# Patient Record
Sex: Female | Born: 2000
Health system: Southern US, Community
[De-identification: ages and names within clinical notes are randomized; demographics above are authoritative.]

## PROBLEM LIST (undated history)

## (undated) ENCOUNTER — Emergency Department (HOSPITAL_BASED_OUTPATIENT_CLINIC_OR_DEPARTMENT_OTHER): Admission: EM | Payer: 59 | Source: Home / Self Care

---
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
## 2021-08-18 ENCOUNTER — Ambulatory Visit: Payer: Self-pay

## 2021-08-18 ENCOUNTER — Other Ambulatory Visit: Payer: Self-pay

## 2021-08-18 ENCOUNTER — Other Ambulatory Visit: Payer: Self-pay | Admitting: Family Medicine

## 2021-08-18 DIAGNOSIS — M25532 Pain in left wrist: Secondary | ICD-10-CM

## 2022-07-17 ENCOUNTER — Ambulatory Visit: Payer: Self-pay

## 2022-07-17 ENCOUNTER — Other Ambulatory Visit: Payer: Self-pay | Admitting: Family Medicine

## 2022-07-17 DIAGNOSIS — Z0000 Encounter for general adult medical examination without abnormal findings: Secondary | ICD-10-CM
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
## 2024-04-15 NOTE — Progress Notes (Signed)
 Hs completed

## 2024-06-19 ENCOUNTER — Inpatient Hospital Stay (HOSPITAL_COMMUNITY)
Admission: AD | Admit: 2024-06-19 | Discharge: 2024-06-19 | Disposition: A | Discharge status: Home / Self Care | Attending: Obstetrics and Gynecology | Admitting: Obstetrics and Gynecology

## 2024-06-19 ENCOUNTER — Inpatient Hospital Stay (HOSPITAL_COMMUNITY)

## 2024-06-19 ENCOUNTER — Encounter (HOSPITAL_COMMUNITY): Payer: Self-pay | Admitting: Obstetrics and Gynecology

## 2024-06-19 DIAGNOSIS — N3000 Acute cystitis without hematuria: Secondary | ICD-10-CM

## 2024-06-19 DIAGNOSIS — Z3A01 Less than 8 weeks gestation of pregnancy: Secondary | ICD-10-CM

## 2024-06-19 DIAGNOSIS — O26891 Other specified pregnancy related conditions, first trimester: Secondary | ICD-10-CM

## 2024-06-19 DIAGNOSIS — R109 Unspecified abdominal pain: Secondary | ICD-10-CM

## 2024-06-19 LAB — WET PREP, GENITAL
Clue Cells Wet Prep HPF POC: NONE SEEN
Sperm: NONE SEEN
Trich, Wet Prep: NONE SEEN
WBC, Wet Prep HPF POC: <10 (ref <10)
Yeast Wet Prep HPF POC: NONE SEEN

## 2024-06-19 LAB — CBC
HCT: 38.9 % (ref 36.0–46.0)
Hemoglobin: 12.6 g/dL (ref 12.0–15.0)
MCH: 26.4 pg (ref 26.0–34.0)
MCHC: 32.4 g/dL (ref 30.0–36.0)
MCV: 81.6 fL (ref 80.0–100.0)
Platelets: 242 K/uL (ref 150–400)
RBC: 4.77 MIL/uL (ref 3.87–5.11)
RDW: 13.9 % (ref 11.5–15.5)
WBC: 9.9 K/uL (ref 4.0–10.5)
nRBC: 0 % (ref 0.0–0.2)

## 2024-06-19 LAB — URINALYSIS, ROUTINE W REFLEX MICROSCOPIC
Bilirubin Urine: NEGATIVE
Glucose, UA: NEGATIVE mg/dL
Hgb urine dipstick: NEGATIVE
Ketones, ur: NEGATIVE mg/dL
Nitrite: NEGATIVE
Protein, ur: NEGATIVE mg/dL
Specific Gravity, Urine: 1.009 (ref 1.005–1.030)
pH: 6 (ref 5.0–8.0)

## 2024-06-19 LAB — HCG, QUANTITATIVE, PREGNANCY: hCG, Beta Chain, Quant, S: 34127 m[IU]/mL — ABNORMAL HIGH (ref <5)

## 2024-06-19 LAB — POCT PREGNANCY, URINE: Preg Test, Ur: POSITIVE — AB

## 2024-06-19 LAB — ABO/RH
ABO/RH(D): O NEG
Antibody Screen: NEGATIVE

## 2024-06-19 LAB — HIV ANTIBODY (ROUTINE TESTING W REFLEX): HIV Screen 4th Generation wRfx: NONREACTIVE

## 2024-06-19 MED ORDER — CEFADROXIL 500 MG PO CAPS: 500.0000 mg | ORAL_CAPSULE | Freq: Two times a day (BID) | ORAL | 0 refills | Status: AC

## 2024-06-19 NOTE — MAU Note (Signed)
 Rita Hawkins is a 23 y.o. at Unknown here in MAU reporting: right sided abd pain that started 2 hours ago. Pain is sharp and intermitten. Pt reports her lmp was 03/10/2024 and she had an SAB in mid October. The md office followed the levels down in the office . Positive preg test in office 2 weejs ago.    LMP: 03/10/2024 Onset of complaint: today Pain score: 4/10 There were no vitals filed for this visit.   FHT: na  Lab orders placed from triage: poc

## 2024-06-19 NOTE — Progress Notes (Signed)
 Joesph Sear PA in with pt to discuss test results and d/c plan. Written and verbal d/c instructions given and pt voiced understanding. Pt d/c home by provider

## 2024-06-19 NOTE — Discharge Instructions (Signed)
 I have sent an antibiotic to the pharmacy to treat the UTI. If the culture comes back in a few days showing that we need to change the antibiotic, I will be in touch with you. Otherwise, finish the full bottle! You may take acetaminophen 500 mg every 4 hours as needed for the pain.  If you are not doing so already, please start taking a prenatal vitamin daily. You can follow up for prenatal care with the OB of your choice.

## 2024-06-19 NOTE — MAU Provider Note (Signed)
 Chief Complaint:  Abdominal Pain   HPI   None     Rita Hawkins is a 23 y.o. G1P0 at Unknown who presents to maternity admissions reporting right abdominal pain. LMP was 03/10/2024 and she had an SAB in October with her OB following hCG levels afterward in the office. She had a positive pregnancy test in her OB office 2 weeks ago. She states right sided abdominal pain started today about 2 hours ago. The pain is sharp and intermittent with 4/10 intensity. Denies fever, vaginal bleeding.   Pregnancy Course: Receives care at [facility].   No past medical history on file. OB History  Gravida Para Term Preterm AB Living  1       SAB IAB Ectopic Multiple Live Births          # Outcome Date GA Lbr Len/2nd Weight Sex Type Anes PTL Lv  1 Current            No past surgical history on file. No family history on file. Social History[1] Allergies[2] No medications prior to admission.    I have reviewed patient's Past Medical Hx, Surgical Hx, Family Hx, Social Hx, medications and allergies.   ROS  Pertinent items noted in HPI and remainder of comprehensive ROS otherwise negative.   PHYSICAL EXAM  Patient Vitals for the past 24 hrs:  BP Temp Temp src Pulse Resp SpO2 Height Weight  06/19/24 1714 (!) 140/64 98.5 F (36.9 C) Oral 71 17 99 % 5' 5 (1.651 m) 109.3 kg    Constitutional: Well-developed, well-nourished female in no acute distress.  HEENT: atraumatic, normocephalic. Neck has normal ROM. EOM intact. Cardiovascular: normal rate & rhythm, warm and well-perfused Respiratory: normal effort, no problems with respiration noted GI: Abd soft, non-tender, non-distended MSK: Extremities nontender, no edema, normal ROM Skin: warm and dry. Acyanotic, no jaundice or pallor. Neurologic: Alert and oriented x 4. No abnormal coordination. Psychiatric: Normal mood. Speech not slurred, not rapid/pressured. Patient is cooperative.  Labs: Results for orders placed or performed during  the hospital encounter of 06/19/24 (from the past 24 hours)  Pregnancy, urine POC     Status: Abnormal   Collection Time: 06/19/24  4:53 PM  Result Value Ref Range   Preg Test, Ur POSITIVE (A) NEGATIVE  Wet prep, genital     Status: None   Collection Time: 06/19/24  5:15 PM   Specimen: PATH Cytology Cervicovaginal Ancillary Only  Result Value Ref Range   Yeast Wet Prep HPF POC NONE SEEN NONE SEEN   Trich, Wet Prep NONE SEEN NONE SEEN   Clue Cells Wet Prep HPF POC NONE SEEN NONE SEEN   WBC, Wet Prep HPF POC <10 <10   Sperm NONE SEEN   Urinalysis, Routine w reflex microscopic -Urine, Clean Catch     Status: Abnormal   Collection Time: 06/19/24  5:18 PM  Result Value Ref Range   Color, Urine YELLOW YELLOW   APPearance HAZY (A) CLEAR   Specific Gravity, Urine 1.009 1.005 - 1.030   pH 6.0 5.0 - 8.0   Glucose, UA NEGATIVE NEGATIVE mg/dL   Hgb urine dipstick NEGATIVE NEGATIVE   Bilirubin Urine NEGATIVE NEGATIVE   Ketones, ur NEGATIVE NEGATIVE mg/dL   Protein, ur NEGATIVE NEGATIVE mg/dL   Nitrite NEGATIVE NEGATIVE   Leukocytes,Ua MODERATE (A) NEGATIVE   RBC / HPF 0-5 0 - 5 RBC/hpf   WBC, UA 0-5 0 - 5 WBC/hpf   Bacteria, UA MANY (A) NONE SEEN  Squamous Epithelial / HPF 6-10 0 - 5 /HPF   Mucus PRESENT    Non Squamous Epithelial 0-5 (A) NONE SEEN  CBC     Status: None   Collection Time: 06/19/24  5:41 PM  Result Value Ref Range   WBC 9.9 4.0 - 10.5 K/uL   RBC 4.77 3.87 - 5.11 MIL/uL   Hemoglobin 12.6 12.0 - 15.0 g/dL   HCT 61.0 63.9 - 53.9 %   MCV 81.6 80.0 - 100.0 fL   MCH 26.4 26.0 - 34.0 pg   MCHC 32.4 30.0 - 36.0 g/dL   RDW 86.0 88.4 - 84.4 %   Platelets 242 150 - 400 K/uL   nRBC 0.0 0.0 - 0.2 %  ABO/Rh     Status: None   Collection Time: 06/19/24  5:41 PM  Result Value Ref Range   ABO/RH(D) O NEG    Antibody Screen      NEG Performed at [facility], [address], [place], [place] [address]   hCG, quantitative, pregnancy     Status: Abnormal   Collection  Time: 06/19/24  5:41 PM  Result Value Ref Range   hCG, Beta Chain, Quant, S 34,127 (H) <5 mIU/mL    Imaging:  US  OB LESS THAN 14 WEEKS WITH OB TRANSVAGINAL Result Date: 06/19/2024 CLINICAL DATA:  Pregnancy and abdominal pain. EXAM: OBSTETRIC <14 WK US  AND TRANSVAGINAL OB US  TECHNIQUE: Both transabdominal and transvaginal ultrasound examinations were performed for complete evaluation of the gestation as well as the maternal uterus, adnexal regions, and pelvic cul-de-sac. Transvaginal technique was performed to assess early pregnancy. COMPARISON:  None Available. FINDINGS: Intrauterine gestational sac: Single intrauterine gestational sac. Yolk sac:  Seen Embryo:  Present Cardiac Activity: Detected Heart Rate: 100 bpm CRL:  2 mm   5 w   5 d                  US  EDC: 02/14/2025 Subchorionic hemorrhage:  None visualized. Maternal uterus/adnexae: The maternal ovaries are unremarkable. There is a corpus luteum in the left ovary. IMPRESSION: Single live intrauterine pregnancy with an estimated gestational age of [redacted] weeks, 5 days. Electronically Signed   By: Vanetta Chou M.D.   On: 06/19/2024 19:36    MDM & MAU COURSE  MDM: Moderate  MAU Course: -Mildly elevated BP. Other vitals within normal limits. -Ectopic rule out: CBC, US , beta-hCG, blood type. -Wet prep, UA, and GC/chlamydia to rule out infectious causes.  -CBC within normal limits. Blood type O NEGATIVE. -Wet prep negative. -UA with moderate leukocytes and non squamous epithelial cells, likely UTI. Will send culture and start antibiotics. -US  with IUGS, YS, and FP with CRL consistent with [redacted]w[redacted]d. Appropriate cardiac activity. -hCG appropriate for gestational age.  Differential diagnosis considered for 1st trimester lower abdominal pain includes but is not limited to: ectopic pregnancy, SAB, torsion, UTI, pyelonephritis, PID, cervicitis, appendicitis, diverticulitis, constipation, nephrolithiasis   Orders Placed This Encounter  Procedures    Wet prep, genital   Culture, OB Urine   US  OB LESS THAN 14 WEEKS WITH OB TRANSVAGINAL   CBC   hCG, quantitative, pregnancy   RPR W/RFLX TO RPR TITER, TREPONEMAL AB, SCREEN AND DIAGNOSIS   HIV Antibody (routine testing w rflx)   Urinalysis, Routine w reflex microscopic -Urine, Clean Catch   Diet NPO time specified   Pregnancy, urine POC   ABO/Rh   Discharge patient   Meds ordered this encounter  Medications   cefadroxil (DURICEF) 500 MG  capsule    Sig: Take 1 capsule (500 mg total) by mouth 2 (two) times daily for 7 days.    Dispense:  14 capsule    Refill:  0    ASSESSMENT   1. Acute cystitis without hematuria   2. Right sided abdominal pain   3. [redacted] weeks gestation of pregnancy     PLAN  Discharge home in stable condition with return precautions.  Duricef 500 mg BID x7 days for UTI. Urine culture pending.  Tylenol prn for pain. Start prenatal vitamin and establish with OB for prenatal care.    Allergies as of 06/19/2024   No Known Allergies      Medication List     TAKE these medications    cefadroxil 500 MG capsule Commonly known as: DURICEF Take 1 capsule (500 mg total) by mouth 2 (two) times daily for 7 days.        Joesph DELENA Sear, PA      [1]  Social History Tobacco Use   Smoking status: Never   Smokeless tobacco: Never  Vaping Use   Vaping status: Former  Substance Use Topics   Alcohol use: Never   Drug use: Never  [2] No Known Allergies

## 2024-06-20 LAB — GC/CHLAMYDIA PROBE AMP (~~LOC~~) NOT AT ARMC
Chlamydia: NEGATIVE
Comment: NEGATIVE
Comment: NORMAL
Neisseria Gonorrhea: NEGATIVE

## 2024-06-20 LAB — SYPHILIS: RPR W/REFLEX TO RPR TITER AND TREPONEMAL ANTIBODIES, TRADITIONAL SCREENING AND DIAGNOSIS ALGORITHM: RPR Ser Ql: NONREACTIVE

## 2024-06-28 ENCOUNTER — Inpatient Hospital Stay (HOSPITAL_COMMUNITY)
Admission: AD | Admit: 2024-06-28 | Discharge: 2024-06-29 | Disposition: A | Discharge status: Home / Self Care | Payer: Self-pay | Attending: Obstetrics and Gynecology | Admitting: Obstetrics and Gynecology

## 2024-06-28 DIAGNOSIS — O26891 Other specified pregnancy related conditions, first trimester: Secondary | ICD-10-CM

## 2024-06-28 DIAGNOSIS — O26892 Other specified pregnancy related conditions, second trimester: Secondary | ICD-10-CM | POA: Diagnosis not present

## 2024-06-28 DIAGNOSIS — O209 Hemorrhage in early pregnancy, unspecified: Secondary | ICD-10-CM

## 2024-06-28 DIAGNOSIS — O208 Other hemorrhage in early pregnancy: Secondary | ICD-10-CM | POA: Diagnosis not present

## 2024-06-28 DIAGNOSIS — R188 Other ascites: Secondary | ICD-10-CM | POA: Insufficient documentation

## 2024-06-28 DIAGNOSIS — R109 Unspecified abdominal pain: Secondary | ICD-10-CM | POA: Insufficient documentation

## 2024-06-28 DIAGNOSIS — Z6791 Unspecified blood type, Rh negative: Secondary | ICD-10-CM | POA: Insufficient documentation

## 2024-06-28 DIAGNOSIS — Z3A14 14 weeks gestation of pregnancy: Secondary | ICD-10-CM | POA: Diagnosis not present

## 2024-06-28 DIAGNOSIS — O2341 Unspecified infection of urinary tract in pregnancy, first trimester: Secondary | ICD-10-CM | POA: Insufficient documentation

## 2024-06-28 DIAGNOSIS — Z3A01 Less than 8 weeks gestation of pregnancy: Secondary | ICD-10-CM

## 2024-06-28 DIAGNOSIS — Z87440 Personal history of urinary (tract) infections: Secondary | ICD-10-CM | POA: Insufficient documentation

## 2024-06-28 NOTE — MAU Note (Signed)
..  Rita Hawkins is a 23 y.o. at [redacted]w[redacted]d here in MAU reporting: cramping that began 2 hours ago. Vaginal bleeding that is brown only when she wipes and she noticed it after the cramping.  Last intercourse: weeks ago Pain score: 3/10 Vitals:   06/29/24 0003  BP: 126/81  Pulse: 82  Resp: 18  Temp: 98.9 F (37.2 C)  SpO2: 100%     FHT:n/a Lab orders placed from triage:  UA

## 2024-06-29 ENCOUNTER — Inpatient Hospital Stay (HOSPITAL_COMMUNITY)

## 2024-06-29 ENCOUNTER — Encounter (HOSPITAL_COMMUNITY): Payer: Self-pay | Admitting: Obstetrics and Gynecology

## 2024-06-29 DIAGNOSIS — O209 Hemorrhage in early pregnancy, unspecified: Secondary | ICD-10-CM | POA: Diagnosis not present

## 2024-06-29 DIAGNOSIS — O26891 Other specified pregnancy related conditions, first trimester: Secondary | ICD-10-CM | POA: Diagnosis not present

## 2024-06-29 DIAGNOSIS — Z6791 Unspecified blood type, Rh negative: Secondary | ICD-10-CM

## 2024-06-29 DIAGNOSIS — R109 Unspecified abdominal pain: Secondary | ICD-10-CM | POA: Diagnosis not present

## 2024-06-29 DIAGNOSIS — O2341 Unspecified infection of urinary tract in pregnancy, first trimester: Secondary | ICD-10-CM

## 2024-06-29 DIAGNOSIS — Z3A01 Less than 8 weeks gestation of pregnancy: Secondary | ICD-10-CM | POA: Diagnosis not present

## 2024-06-29 LAB — URINALYSIS, ROUTINE W REFLEX MICROSCOPIC
Bilirubin Urine: NEGATIVE
Glucose, UA: NEGATIVE mg/dL
Ketones, ur: NEGATIVE mg/dL
Nitrite: NEGATIVE
Protein, ur: NEGATIVE mg/dL
Specific Gravity, Urine: 1.01 (ref 1.005–1.030)
pH: 6 (ref 5.0–8.0)

## 2024-06-29 LAB — URINALYSIS, MICROSCOPIC (REFLEX)

## 2024-06-29 MED ORDER — RHO D IMMUNE GLOBULIN 1500 UNIT/2ML IJ SOSY
300.0000 ug | PREFILLED_SYRINGE | Freq: Once | INTRAMUSCULAR | Status: AC
  Administered 2024-06-29: 300 ug via INTRAMUSCULAR
  Filled 2024-06-29: qty 2

## 2024-06-29 NOTE — MAU Provider Note (Signed)
 History    None     Chief Complaint:  Abdominal Pain and Vaginal Bleeding   Rita Hawkins is  23 y.o. G2P0010 who is here for cramping and brown spotting x 2 hours.  She is [redacted]w[redacted]d weeks gestation  by early ultrasound.  Live single intrauterine pregnancy verified by 5.5-week ultrasound on 06/19/2024.  Dx. UTI 06/19/24. Tx w/ Duricef who she states she took as directed but thinks she may still have UTI. Culture not done or not available.   ROS Abdominal Pain: 3/10 cramping Vaginal bleeding: brown and spotting.   Passage of clots or tissue: Denies Dizziness: Denies  O NEG  Physical Exam   Patient Vitals for the past 24 hrs:  BP Temp Temp src Pulse Resp SpO2 Height Weight  06/29/24 0003 126/81 98.9 F (37.2 C) Oral 82 18 100 % 5' 5 (1.651 m) 109.7 kg   Constitutional: Well-nourished female in no apparent distress. No pallor Neuro: Alert and oriented 4 Cardiovascular: Normal rate Respiratory: Normal effort and rate Abdomen: deferred Gynecological Exam: Deferred  Labs: Results for orders placed or performed during the hospital encounter of 06/28/24 (from the past 24 hours)  Urinalysis, Routine w reflex microscopic -Urine, Clean Catch   Collection Time: 06/28/24 11:47 PM  Result Value Ref Range   Color, Urine YELLOW YELLOW   APPearance CLEAR CLEAR   Specific Gravity, Urine 1.010 1.005 - 1.030   pH 6.0 5.0 - 8.0   Glucose, UA NEGATIVE NEGATIVE mg/dL   Hgb urine dipstick MODERATE (A) NEGATIVE   Bilirubin Urine NEGATIVE NEGATIVE   Ketones, ur NEGATIVE NEGATIVE mg/dL   Protein, ur NEGATIVE NEGATIVE mg/dL   Nitrite NEGATIVE NEGATIVE   Leukocytes,Ua TRACE (A) NEGATIVE  Urinalysis, Microscopic (reflex)   Collection Time: 06/28/24 11:47 PM  Result Value Ref Range   RBC / HPF 0-5 0 - 5 RBC/hpf   WBC, UA 0-5 0 - 5 WBC/hpf   Bacteria, UA MANY (A) NONE SEEN   Squamous Epithelial / HPF 0-5 0 - 5 /HPF   Mucus PRESENT   Rh IG workup (includes ABO/Rh)   Collection Time: 06/29/24   2:29 AM  Result Value Ref Range   Gestational Age(Wks) 7    ABO/RH(D) O NEG    Antibody Screen      NEG Performed at [facility], [address], [place], [place] [address]    Unit Number [id number]    Blood Component Type RHIG    Unit division 00    Status of Unit ALLOCATED    Transfusion Status OK TO TRANSFUSE     Ultrasound Studies:   US  OB Transvaginal Result Date: 06/29/2024 EXAM: OBSTETRIC ULTRASOUND FIRST TRIMESTER TECHNIQUE: Transvaginal first trimester obstetric pelvic duplex ultrasound was performed with real-time imaging and color flow Doppler imaging. COMPARISON: 06/19/2024 CLINICAL HISTORY: Vaginal bleeding and positive pregnancy test. FINDINGS: UTERUS: No focal myometrial mass. GESTATIONAL SAC(S): Single intrauterine gestation is noted. Small subchorionic hemorrhage is noted. YOLK SAC: A 3 mm yolk sac is identified. EMBRYO(<11WK) /FETUS(>=11WK): An embryo is seen with positive cardiac activity. CROWN RUMP LENGTH: Crown-rump length measures 10.9 mm which corresponds to a gestational age of [redacted] weeks 1 day. This shows normal progression when compared with the prior exam. RATE OF CARDIAC ACTIVITY: Heart rate is 152 beats per minute. RIGHT OVARY: Ovaries are well visualized bilaterally without focal abnormality. Normal arterial and venous flow. LEFT OVARY: Ovaries are well visualized bilaterally without focal abnormality. Normal arterial and venous flow. FREE FLUID: Mild free fluid  is noted within the pelvis. MEASUREMENTS ESTIMATED GESTATIONAL AGE BY CURRENT ULTRASOUND: 7 weeks 1 day ESTIMATED GESTATIONAL AGE BY LMP/PRIOR ULTRASOUND: Prior ultrasound comparison shows normal progression. ESTIMATED DUE DATE: 02/14/2025 IMPRESSION: 1. Single intrauterine gestation at 7 weeks 1 day with positive cardiac activity, showing normal progression compared to the prior exam. 2. Small subchorionic hemorrhage. 3. Mild free fluid within the pelvis. Electronically signed by: Oneil Devonshire MD  06/29/2024 03:00 AM EST RP Workstation: [id number]   US  OB LESS THAN 14 WEEKS WITH OB TRANSVAGINAL Result Date: 06/19/2024 CLINICAL DATA:  Pregnancy and abdominal pain. EXAM: OBSTETRIC <14 WK US  AND TRANSVAGINAL OB US  TECHNIQUE: Both transabdominal and transvaginal ultrasound examinations were performed for complete evaluation of the gestation as well as the maternal uterus, adnexal regions, and pelvic cul-de-sac. Transvaginal technique was performed to assess early pregnancy. COMPARISON:  None Available. FINDINGS: Intrauterine gestational sac: Single intrauterine gestational sac. Yolk sac:  Seen Embryo:  Present Cardiac Activity: Detected Heart Rate: 100 bpm CRL:  2 mm   5 w   5 d                  US  EDC: 02/14/2025 Subchorionic hemorrhage:  None visualized. Maternal uterus/adnexae: The maternal ovaries are unremarkable. There is a corpus luteum in the left ovary. IMPRESSION: Single live intrauterine pregnancy with an estimated gestational age of [redacted] weeks, 5 days. Electronically Signed   By: Vanetta Chou M.D.   On: 06/19/2024 19:36    MAU course/MDM: Orders Placed This Encounter  Procedures   Culture, OB Urine   US  OB Transvaginal   Urinalysis, Routine w reflex microscopic -Urine, Clean Catch   Urinalysis, Microscopic (reflex)   Rh IG workup (includes ABO/Rh)   Blood transfusion report - scanned   Discharge patient Discharge disposition: 01-Home or Self Care; Discharge patient date: 06/29/2024   Meds ordered this encounter  Medications   rho (d) immune globulin  (RHIG/RHOPHYLAC ) injection 300 mcg   Vaginal bleeding in early pregnancy with normal FHT and and progression of pregnancy. Hemodynamically stable.  Assessment: 1. Rh negative status during pregnancy in first trimester   2. Vaginal bleeding in pregnancy, first trimester   3. Urinary tract infection in mother during first trimester of pregnancy   4. Abdominal pain during pregnancy, first trimester   5. [redacted] weeks gestation of  pregnancy    Plan: Discharge home in stable condition. Vaginal bleeding precautions  Follow-up Information     [place] Follow up on 07/08/2024.   Why: As scheduled or sooner as need if symptoms worsen. Contact information: [address] [place] [address] [phone]         [facility] Follow up.   Specialty: Obstetrics and Gynecology Why: As needed in emergencies Contact information: [address] [place]  [phone]               Allergies as of 06/29/2024   No Known Allergies      Medication List     STOP taking these medications    cefadroxil  500 MG capsule Commonly known as: DURICEF       TAKE these medications    multivitamin-prenatal 27-0.8 MG Tabs tablet Take 1 tablet by mouth daily at 12 noon.        Claudene, Tyla Burgner , CNM 06/29/2024, 6:40 AM  2/3

## 2024-06-29 NOTE — Discharge Instructions (Signed)
 Return to MAU: If you have heavier bleeding that soaks through more that 2 pads per hour for an hour or more If you bleed so much that you feel like you might pass out or you do pass out If you have significant abdominal pain that is not improved with Tylenol 1000 mg every 8 hours as needed for pain If you develop a fever > 100.5

## 2024-06-30 LAB — CULTURE, OB URINE: Culture: NO GROWTH

## 2024-06-30 LAB — RH IG WORKUP (INCLUDES ABO/RH)
ABO/RH(D): O NEG
Antibody Screen: NEGATIVE
Gestational Age(Wks): 7
Unit division: 0

## 2024-07-02 ENCOUNTER — Ambulatory Visit: Payer: Self-pay | Admitting: Advanced Practice Midwife
